# Patient Record
Sex: Male | Born: 1945 | Race: Black or African American | Hispanic: No | Marital: Single | Smoking: Former smoker
Health system: Southern US, Community
[De-identification: ages and names within clinical notes are randomized; demographics above are authoritative.]

## PROBLEM LIST (undated history)

## (undated) DIAGNOSIS — K769 Liver disease, unspecified: Secondary | ICD-10-CM

## (undated) DIAGNOSIS — I2510 Atherosclerotic heart disease of native coronary artery without angina pectoris: Secondary | ICD-10-CM

## (undated) DIAGNOSIS — I10 Essential (primary) hypertension: Secondary | ICD-10-CM

## (undated) HISTORY — PX: FRACTURE SURGERY: SHX138

---

## 2016-04-03 ENCOUNTER — Emergency Department (HOSPITAL_COMMUNITY)
Admission: EM | Admit: 2016-04-03 | Discharge: 2016-04-03 | Disposition: A | Discharge status: Home / Self Care | Payer: Commercial Managed Care - HMO | Attending: Emergency Medicine | Admitting: Emergency Medicine

## 2016-04-03 ENCOUNTER — Encounter (HOSPITAL_COMMUNITY): Payer: Self-pay | Admitting: *Deleted

## 2016-04-03 DIAGNOSIS — F1290 Cannabis use, unspecified, uncomplicated: Secondary | ICD-10-CM | POA: Insufficient documentation

## 2016-04-03 DIAGNOSIS — M62831 Muscle spasm of calf: Secondary | ICD-10-CM | POA: Diagnosis not present

## 2016-04-03 DIAGNOSIS — Z87891 Personal history of nicotine dependence: Secondary | ICD-10-CM | POA: Insufficient documentation

## 2016-04-03 DIAGNOSIS — M79605 Pain in left leg: Secondary | ICD-10-CM | POA: Diagnosis present

## 2016-04-03 DIAGNOSIS — M62838 Other muscle spasm: Secondary | ICD-10-CM

## 2016-04-03 HISTORY — DX: Essential (primary) hypertension: I10

## 2016-04-03 MED ORDER — NAPROXEN 500 MG PO TABS: 500.0000 mg | ORAL_TABLET | Freq: Two times a day (BID) | ORAL | 0 refills | Status: DC

## 2016-04-03 MED ORDER — CYCLOBENZAPRINE HCL 10 MG PO TABS: 10.0000 mg | ORAL_TABLET | Freq: Two times a day (BID) | ORAL | 0 refills | Status: DC | PRN

## 2016-04-03 NOTE — ED Triage Notes (Signed)
Pt reports L lateral thigh pain x 2 days.  Denies any injury

## 2016-04-03 NOTE — ED Notes (Signed)
Bed: WA08 Expected date:  Expected time:  Means of arrival:  Comments: 

## 2016-04-03 NOTE — Discharge Instructions (Signed)
Take your medications as prescribed as needed for pain relief. I recommend applying heat to affected area for 15 minutes 3-4 times daily. Please follow up with a primary care provider from the Resource Guide provided below in 1 week as needed. Please return to the Emergency Department if symptoms worsen or new onset of fever, back pain, chest pain, abdominal pain, numbness, saline, weakness.

## 2016-04-03 NOTE — ED Provider Notes (Signed)
[facility]-EMERGENCY DEPT Provider Note   CSN: [id number] Arrival date & time: 04/03/16  0920     History   Chief Complaint Chief Complaint  Patient presents with  . Leg Pain    HPI Jesse York is a 70 y.o. male.  Patient is a 70 year old male with history of hypertension who presents the ED with complaint of left leg pain, onset 2 days. Patient reports over the past 2 days he has had pain in his left hamstring when he wakes up in the morning. He reports having "spasms" to the back of his leg. Denies radiation. He states after he walks around for a few minutes the pain resolves. He notes he has been taking Advil and an old muscle relaxer at home without relief. Denies any recent fall, trauma or injury. Denies any new exercise or activity. Denies fever, neck or back pain, chest pain, abdominal pain, saddle anesthesia, urinary or bowel incontinence, numbness, tingling, weakness. Denies any recent surgeries/mobilization, recent long car rides/airplane travel, history of cancer, history of blood clots, leg swelling.      Past Medical History:  Diagnosis Date  . Hypertension    "Borderline"    There are no active problems to display for this patient.   Past Surgical History:  Procedure Laterality Date  . FRACTURE SURGERY Left    LLE   . FRACTURE SURGERY     Jaw       Home Medications    Prior to Admission medications   Medication Sig Start Date End Date Taking? Authorizing Provider  cyclobenzaprine (FLEXERIL) 10 MG tablet Take 1 tablet (10 mg total) by mouth 2 (two) times daily as needed for muscle spasms. 04/03/16   Barrett HenleNicole Elizabeth Lynn Recendiz, PA-C  naproxen (NAPROSYN) 500 MG tablet Take 1 tablet (500 mg total) by mouth 2 (two) times daily. 04/03/16   Barrett HenleNicole Elizabeth Nahmir Zeidman, PA-C    Family History No family history on file.  Social History Social History  Substance Use Topics  . Smoking status: Former Games developermoker  . Smokeless tobacco: Never Used  . Alcohol use No      Allergies   Review of patient's allergies indicates not on file.   Review of Systems Review of Systems  Musculoskeletal: Positive for arthralgias (left thigh pain).  All other systems reviewed and are negative.    Physical Exam Updated Vital Signs BP 143/80 (BP Location: Left Arm)   Pulse (!) 56   Temp 98.6 F (37 C) (Oral)   Resp 17   Ht 5\' 9"  (1.753 m)   Wt 104.3 kg   SpO2 95%   BMI 33.97 kg/m   Physical Exam  Constitutional: He is oriented to person, place, and time. He appears well-developed and well-nourished.  HENT:  Head: Normocephalic and atraumatic.  Mouth/Throat: Oropharynx is clear and moist. No oropharyngeal exudate.  Eyes: Conjunctivae and EOM are normal. Right eye exhibits no discharge. Left eye exhibits no discharge. No scleral icterus.  Neck: Normal range of motion. Neck supple.  Cardiovascular: Normal rate, regular rhythm, normal heart sounds and intact distal pulses.   Pulmonary/Chest: Effort normal and breath sounds normal. No respiratory distress. He has no wheezes. He has no rales. He exhibits no tenderness.  Abdominal: Soft. Bowel sounds are normal. He exhibits no distension and no mass. There is no tenderness. There is no rebound and no guarding. No hernia.  Musculoskeletal: Normal range of motion. He exhibits no edema or deformity.       Left upper leg: He exhibits  tenderness (mild). He exhibits no swelling, no edema, no deformity and no laceration.  No midline C, T, or L tenderness. Full range of motion of neck and back. Full range of motion of bilateral upper and lower extremities, with 5/5 strength. Mild TTP over left hamstring. No TTP over hips, no pelvic instability. Sensation intact. 2+ radial and PT pulses. Cap refill <2 seconds.   Neurological: He is alert and oriented to person, place, and time.  Skin: Skin is warm and dry.  Nursing note and vitals reviewed.    ED Treatments / Results  Labs (all labs ordered are listed, but only  abnormal results are displayed) Labs Reviewed - No data to display  EKG  EKG Interpretation None       Radiology No results found.  Procedures Procedures (including critical care time)  Medications Ordered in ED Medications - No data to display   Initial Impression / Assessment and Plan / ED Course  I have reviewed the triage vital signs and the nursing notes.  Pertinent labs & imaging results that were available during my care of the patient were reviewed by me and considered in my medical decision making (see chart for details).  Clinical Course    Patient presents with pain to his left hamstring which he describes as "spasms". Denies any recent fall, trauma or injury. VSS. Exam revealed mild tenderness over left hamstring. Full range of motion of bilateral lower extremities which are neurovascularly intact. No midline spinal tenderness. No abdominal tenderness. Remaining exam unremarkable. Suspect patient's symptoms are likely due to muscle spasm. Plan discharge patient home with muscle relaxant, NSAID and symptomatic treatment. Patient given information to follow up with PCP. Discussed return precautions.  Final Clinical Impressions(s) / ED Diagnoses   Final diagnoses:  Muscle spasm    New Prescriptions New Prescriptions   CYCLOBENZAPRINE (FLEXERIL) 10 MG TABLET    Take 1 tablet (10 mg total) by mouth 2 (two) times daily as needed for muscle spasms.   NAPROXEN (NAPROSYN) 500 MG TABLET    Take 1 tablet (500 mg total) by mouth 2 (two) times daily.     Satira Sark [place], [place] 04/03/16 1145    Gerhard Munch, MD 04/04/16 [phone]

## 2016-04-05 ENCOUNTER — Encounter (HOSPITAL_COMMUNITY): Payer: Self-pay | Admitting: *Deleted

## 2016-04-05 ENCOUNTER — Emergency Department (HOSPITAL_COMMUNITY)
Admission: EM | Admit: 2016-04-05 | Discharge: 2016-04-05 | Disposition: A | Discharge status: Home / Self Care | Payer: Commercial Managed Care - HMO | Attending: Emergency Medicine | Admitting: Emergency Medicine

## 2016-04-05 DIAGNOSIS — Z79899 Other long term (current) drug therapy: Secondary | ICD-10-CM | POA: Insufficient documentation

## 2016-04-05 DIAGNOSIS — R030 Elevated blood-pressure reading, without diagnosis of hypertension: Secondary | ICD-10-CM | POA: Diagnosis not present

## 2016-04-05 DIAGNOSIS — Z87891 Personal history of nicotine dependence: Secondary | ICD-10-CM | POA: Diagnosis not present

## 2016-04-05 DIAGNOSIS — F1290 Cannabis use, unspecified, uncomplicated: Secondary | ICD-10-CM | POA: Diagnosis not present

## 2016-04-05 DIAGNOSIS — R300 Dysuria: Secondary | ICD-10-CM | POA: Diagnosis present

## 2016-04-05 DIAGNOSIS — Z9114 Patient's other noncompliance with medication regimen: Secondary | ICD-10-CM | POA: Diagnosis not present

## 2016-04-05 DIAGNOSIS — N342 Other urethritis: Secondary | ICD-10-CM | POA: Insufficient documentation

## 2016-04-05 LAB — URINALYSIS, ROUTINE W REFLEX MICROSCOPIC
BILIRUBIN URINE: NEGATIVE
Glucose, UA: NEGATIVE mg/dL
Hgb urine dipstick: NEGATIVE
Ketones, ur: NEGATIVE mg/dL
Leukocytes, UA: NEGATIVE
NITRITE: NEGATIVE
PH: 5.5 (ref 5.0–8.0)
Protein, ur: NEGATIVE mg/dL
SPECIFIC GRAVITY, URINE: 1.022 (ref 1.005–1.030)

## 2016-04-05 LAB — GC/CHLAMYDIA PROBE AMP (~~LOC~~) NOT AT ARMC
Chlamydia: NEGATIVE
Neisseria Gonorrhea: NEGATIVE

## 2016-04-05 MED ORDER — LIDOCAINE HCL 1 % IJ SOLN
INTRAMUSCULAR | Status: AC
  Administered 2016-04-05: 0.9 mL
  Filled 2016-04-05: qty 20

## 2016-04-05 MED ORDER — CEFTRIAXONE SODIUM 250 MG IJ SOLR
250.0000 mg | Freq: Once | INTRAMUSCULAR | Status: AC
Start: 2016-04-05 — End: 2016-04-05
  Administered 2016-04-05: 250 mg via INTRAMUSCULAR
  Filled 2016-04-05: qty 250

## 2016-04-05 MED ORDER — AZITHROMYCIN 250 MG PO TABS
1000.0000 mg | ORAL_TABLET | Freq: Once | ORAL | Status: AC
  Administered 2016-04-05: 1000 mg via ORAL
  Filled 2016-04-05: qty 4

## 2016-04-05 NOTE — Discharge Instructions (Signed)
Use a condom each time you have sex. [facility]. A primary care physician. Your blood pressure should be rechecked in a week. Today's was elevated at 164/90. Take your blood pressure medication as prescribed and all medications as prescribed.

## 2016-04-05 NOTE — ED Triage Notes (Signed)
Pt states that he had unprotected sex and oral sex on Wed;  Pt states that since then he is having a tingling sensation to his penis a dry throat; pt denies drainage from his penis or any noticeable irritation or bumps

## 2016-04-05 NOTE — ED Provider Notes (Signed)
[facility]PT Provider Note   CSN: [id number] Arrival date & time: 04/05/16  [id number]     History   Chief Complaint Chief Complaint  Patient presents with  . SEXUALLY TRANSMITTED DISEASE    HPI Jesse York is a 70 y.o. male.Patient has burning at penis with urination since having unprotected sex. Dysuria started yesterday. He had unprotected sex 2 days ago. He also complains of a vague discomfort in his throat. He did perform oral sex. No other associated symptoms. No treatment prior to coming here burning worse with urination not improved by anything.  HPI  Past Medical History:  Diagnosis Date  . Hypertension    "Borderline"   Liver disease There are no active problems to display for this patient.   Past Surgical History:  Procedure Laterality Date  . FRACTURE SURGERY Left    LLE   . FRACTURE SURGERY     Jaw       Home Medications    Prior to Admission medications   Medication Sig Start Date End Date Taking? Authorizing Provider  cyclobenzaprine (FLEXERIL) 10 MG tablet Take 1 tablet (10 mg total) by mouth 2 (two) times daily as needed for muscle spasms. 04/03/16   Barrett HenleNicole Elizabeth Nadeau, PA-C  naproxen (NAPROSYN) 500 MG tablet Take 1 tablet (500 mg total) by mouth 2 (two) times daily. 04/03/16   Barrett HenleNicole Elizabeth Nadeau, PA-C   Unknown blood pressure medication he's been noncompliant for one week Family History No family history on file.  Social History Social History  Substance Use Topics  . Smoking status: Former Games developermoker  . Smokeless tobacco: Never Used  . Alcohol use No     Allergies   Review of patient's allergies indicates no known allergies.   Review of Systems Review of Systems  Constitutional: Negative.   HENT: Positive for sore throat.   Respiratory: Negative.   Cardiovascular: Negative.   Gastrointestinal: Negative.   Genitourinary: Positive for dysuria.  Musculoskeletal: Negative.   Skin: Negative.   Neurological: Negative.     Psychiatric/Behavioral: Negative.   All other systems reviewed and are negative.    Physical Exam Updated Vital Signs BP 164/90 (BP Location: Left Arm)   Pulse (!) 58   Temp 97.6 F (36.4 C) (Oral)   Resp 15   SpO2 100%   Physical Exam  Constitutional: He appears well-developed and well-nourished.  HENT:  Head: Normocephalic and atraumatic.  Eyes: Conjunctivae are normal. Pupils are equal, round, and reactive to light.  Neck: Neck supple. No tracheal deviation present. No thyromegaly present.  Cardiovascular: Normal rate and regular rhythm.   No murmur heard. Pulmonary/Chest: Effort normal and breath sounds normal.  Abdominal: Soft. Bowel sounds are normal. He exhibits no distension. There is no tenderness.  Genitourinary: Penis normal.  Genitourinary Comments: Circumcized, no urethral d/c  Musculoskeletal: Normal range of motion. He exhibits no edema or tenderness.  Neurological: He is alert. Coordination normal.  Skin: Skin is warm and dry. No rash noted.  Psychiatric: He has a normal mood and affect.  Nursing note and vitals reviewed.    ED Treatments / Results  Labs (all labs ordered are listed, but only abnormal results are displayed) Labs Reviewed  URINALYSIS, ROUTINE W REFLEX MICROSCOPIC (NOT AT [facility])  RPR  HIV ANTIBODY (ROUTINE TESTING)  GC/CHLAMYDIA PROBE AMP ([place]) NOT AT [facility]    EKG  EKG Interpretation None       Radiology No results found.  Procedures Procedures (including critical care time)  Medications  Ordered in ED Medications  cefTRIAXone (ROCEPHIN) injection 250 mg (not administered)  azithromycin (ZITHROMAX) tablet 1,000 mg (not administered)     Initial Impression / Assessment and Plan / ED Course  I have reviewed the triage vital signs and the nursing notes.  Pertinent labs & imaging results that were available during my care of the patient were reviewed by me and considered in my medical decision making (see chart for  details).  Clinical Course    Treat empirically for STDs. Safe sex encouraged. Blood pressure recheck one week. I stressed importance of taking blood pressure medications. Referral [place] [facility] to get PCP  Final Clinical Impressions(s) / ED Diagnoses  Diagnoses #1 urethritis #2 elevated blood pressure #3 medication noncompliance Final diagnoses:  Urethritis    New Prescriptions New Prescriptions   No medications on file     Doug Sou, MD 04/05/16 [phone]

## 2016-04-05 NOTE — ED Notes (Signed)
Bed: WA05 Expected date:  Expected time:  Means of arrival:  Comments: 

## 2016-04-06 LAB — RPR: RPR: NONREACTIVE

## 2016-04-06 LAB — HIV ANTIBODY (ROUTINE TESTING W REFLEX): HIV SCREEN 4TH GENERATION: NONREACTIVE

## 2016-08-05 ENCOUNTER — Emergency Department (HOSPITAL_COMMUNITY): Payer: Commercial Managed Care - HMO

## 2016-08-05 ENCOUNTER — Encounter (HOSPITAL_COMMUNITY): Payer: Self-pay | Admitting: Emergency Medicine

## 2016-08-05 DIAGNOSIS — R079 Chest pain, unspecified: Secondary | ICD-10-CM | POA: Insufficient documentation

## 2016-08-05 DIAGNOSIS — Z91040 Latex allergy status: Secondary | ICD-10-CM | POA: Insufficient documentation

## 2016-08-05 DIAGNOSIS — A64 Unspecified sexually transmitted disease: Secondary | ICD-10-CM | POA: Insufficient documentation

## 2016-08-05 DIAGNOSIS — Z87891 Personal history of nicotine dependence: Secondary | ICD-10-CM | POA: Diagnosis not present

## 2016-08-05 DIAGNOSIS — Z79899 Other long term (current) drug therapy: Secondary | ICD-10-CM | POA: Insufficient documentation

## 2016-08-05 DIAGNOSIS — I2510 Atherosclerotic heart disease of native coronary artery without angina pectoris: Secondary | ICD-10-CM | POA: Diagnosis not present

## 2016-08-05 LAB — BASIC METABOLIC PANEL
Anion gap: 7 (ref 5–15)
BUN: 12 mg/dL (ref 6–20)
CHLORIDE: 105 mmol/L (ref 101–111)
CO2: 26 mmol/L (ref 22–32)
CREATININE: 0.95 mg/dL (ref 0.61–1.24)
Calcium: 8.9 mg/dL (ref 8.9–10.3)
GFR calc Af Amer: >60 mL/min (ref >60)
GFR calc non Af Amer: >60 mL/min (ref >60)
GLUCOSE: 105 mg/dL — AB (ref 65–99)
Potassium: 4.1 mmol/L (ref 3.5–5.1)
Sodium: 138 mmol/L (ref 135–145)

## 2016-08-05 LAB — CBC
HCT: 41.5 % (ref 39.0–52.0)
Hemoglobin: 14.5 g/dL (ref 13.0–17.0)
MCH: 33.6 pg (ref 26.0–34.0)
MCHC: 34.9 g/dL (ref 30.0–36.0)
MCV: 96.1 fL (ref 78.0–100.0)
PLATELETS: 175 10*3/uL (ref 150–400)
RBC: 4.32 MIL/uL (ref 4.22–5.81)
RDW: 13.8 % (ref 11.5–15.5)
WBC: 4.9 10*3/uL (ref 4.0–10.5)

## 2016-08-05 LAB — I-STAT TROPONIN, ED: Troponin i, poc: 0 ng/mL (ref 0.00–0.08)

## 2016-08-05 NOTE — ED Triage Notes (Signed)
Pt states he was having sex yesterday and started having sharp chest pain on the left side  PT states he got very diaphoretic and short of breath  Pt states he had another episode of the chest pain today  Pt denies any pain at this time  Pt states he would also like to be checked for STDs while he is here

## 2016-08-06 ENCOUNTER — Emergency Department (HOSPITAL_COMMUNITY)
Admission: EM | Admit: 2016-08-06 | Discharge: 2016-08-06 | Disposition: A | Discharge status: Home / Self Care | Payer: Commercial Managed Care - HMO | Attending: Emergency Medicine | Admitting: Emergency Medicine

## 2016-08-06 DIAGNOSIS — R079 Chest pain, unspecified: Secondary | ICD-10-CM

## 2016-08-06 DIAGNOSIS — A64 Unspecified sexually transmitted disease: Secondary | ICD-10-CM

## 2016-08-06 HISTORY — DX: Atherosclerotic heart disease of native coronary artery without angina pectoris: I25.10

## 2016-08-06 HISTORY — DX: Liver disease, unspecified: K76.9

## 2016-08-06 LAB — I-STAT TROPONIN, ED: TROPONIN I, POC: 0.01 ng/mL (ref 0.00–0.08)

## 2016-08-06 MED ORDER — CEFTRIAXONE SODIUM 250 MG IJ SOLR
250.0000 mg | Freq: Once | INTRAMUSCULAR | Status: AC
  Administered 2016-08-06: 250 mg via INTRAMUSCULAR
  Filled 2016-08-06: qty 250

## 2016-08-06 MED ORDER — LIDOCAINE HCL 1 % IJ SOLN
INTRAMUSCULAR | Status: AC
  Filled 2016-08-06: qty 20

## 2016-08-06 MED ORDER — AZITHROMYCIN 1 G PO PACK
1.0000 g | PACK | Freq: Once | ORAL | Status: AC
  Administered 2016-08-06: 1 g via ORAL
  Filled 2016-08-06: qty 1

## 2016-08-06 NOTE — ED Provider Notes (Signed)
[facility]PT Provider Note   CSN: [id number] Arrival date & time: 08/05/16  1943 By signing my name below, I, [name], attest that this documentation has been prepared under the direction and in the presence of Tomasita Crumble, MD. Electronically Signed: [name], ED Scribe. 08/06/16. 2:56 AM.  History   Chief Complaint Chief Complaint  Patient presents with  . Chest Pain  . STD check    HPI The history is provided by the patient. No language interpreter was used.   HPI Comments: Cong Jesse York is a 71 y.o. male with h/o CAD and HTN, who presents to the Emergency Department complaining of intermittent, sharp chest pain onset two days ago with associated dysuria, diaphoresis, and shortness of breath. Pt states he was having sex two days ago when the chest pain initially came on. Pt denies any modifying factors. No alleviating factors noted. Pt is concerned his symptoms are consistent with an STD; he is currently requesting to be checked, noting he has a new sexual partner. Pt denies fever, chills, vomiting, penile discharge, or any other associated symptoms.   Past Medical History:  Diagnosis Date  . CAD (coronary artery disease)   . Hypertension    "Borderline"  . Liver disease     There are no active problems to display for this patient.   Past Surgical History:  Procedure Laterality Date  . FRACTURE SURGERY Left    LLE   . FRACTURE SURGERY     Jaw       Home Medications    Prior to Admission medications   Medication Sig Start Date End Date Taking? Authorizing Provider  cyclobenzaprine (FLEXERIL) 10 MG tablet Take 1 tablet (10 mg total) by mouth 2 (two) times daily as needed for muscle spasms. 04/03/16   Barrett Henle, PA-C  naproxen (NAPROSYN) 500 MG tablet Take 1 tablet (500 mg total) by mouth 2 (two) times daily. 04/03/16   Barrett Henle, PA-C    Family History Family History  Problem Relation Age of Onset  . Seizures Brother   .  Hypertension Other   . Diabetes Other     Social History Social History  Substance Use Topics  . Smoking status: Former Games developer  . Smokeless tobacco: Never Used  . Alcohol use Yes     Comment: occ     Allergies   Latex   Review of Systems Review of Systems  Constitutional: Positive for diaphoresis. Negative for chills and fever.  Respiratory: Positive for shortness of breath.   Cardiovascular: Positive for chest pain.  Gastrointestinal: Negative for vomiting.  Genitourinary: Positive for dysuria. Negative for discharge.  All other systems reviewed and are negative.    Physical Exam Updated Vital Signs BP 169/80 (BP Location: Left Arm)   Pulse 65   Temp 98.3 F (36.8 C) (Oral)   Resp 18   Ht 5\' 9"  (1.753 m)   Wt 230 lb (104.3 kg)   SpO2 97%   BMI 33.97 kg/m   Physical Exam  Constitutional: He is oriented to person, place, and time. Vital signs are normal. He appears well-developed and well-nourished.  Non-toxic appearance. He does not appear ill. No distress.  HENT:  Head: Normocephalic and atraumatic.  Nose: Nose normal.  Mouth/Throat: Oropharynx is clear and moist. No oropharyngeal exudate.  Eyes: Conjunctivae and EOM are normal. Pupils are equal, round, and reactive to light. No scleral icterus.  Neck: Normal range of motion. Neck supple. No tracheal deviation, no edema, no erythema  and normal range of motion present. No thyroid mass and no thyromegaly present.  Cardiovascular: Normal rate, regular rhythm, S1 normal, S2 normal, normal heart sounds, intact distal pulses and normal pulses.  Exam reveals no gallop and no friction rub.   No murmur heard. Pulmonary/Chest: Effort normal and breath sounds normal. No respiratory distress. He has no wheezes. He has no rhonchi. He has no rales.  Abdominal: Soft. Normal appearance and bowel sounds are normal. He exhibits no distension, no ascites and no mass. There is no hepatosplenomegaly. There is no tenderness. There is  no rebound, no guarding and no CVA tenderness.  Genitourinary: Testes normal and penis normal. Right testis shows no swelling and no tenderness. Left testis shows no swelling and no tenderness. No penile tenderness. No discharge found.  Musculoskeletal: Normal range of motion. He exhibits no edema or tenderness.  Lymphadenopathy:    He has no cervical adenopathy.  Neurological: He is alert and oriented to person, place, and time. He has normal strength. No cranial nerve deficit or sensory deficit.  Skin: Skin is warm, dry and intact. No petechiae and no rash noted. He is not diaphoretic. No erythema. No pallor.  Nursing note and vitals reviewed.    ED Treatments / Results  DIAGNOSTIC STUDIES: Oxygen Saturation is 97% on RA, adequate by my interpretation.    COORDINATION OF CARE: 2:56 AM Discussed treatment plan with pt at bedside which includes STD check and pt agreed to plan.  Labs (all labs ordered are listed, but only abnormal results are displayed) Labs Reviewed  BASIC METABOLIC PANEL - Abnormal; Notable for the following:       Result Value   Glucose, Bld 105 (*)    All other components within normal limits  CBC  URINALYSIS, ROUTINE W REFLEX MICROSCOPIC  I-STAT TROPOININ, ED  I-STAT TROPOININ, ED  GC/CHLAMYDIA PROBE AMP ([place]) NOT AT [facility]    EKG  EKG Interpretation  Date/Time:  Monday August 05 2016 19:57:18 EST Ventricular Rate:  62 PR Interval:    QRS Duration: 101 QT Interval:  438 QTC Calculation: 445 R Axis:   91 Text Interpretation:  Sinus rhythm Ventricular premature complex Probable left atrial enlargement Right axis deviation No old tracing to compare Confirmed by Erroll Luna [phone]) on 08/06/2016 2:34:22 AM       Radiology Dg Chest 2 View  Result Date: 08/05/2016 CLINICAL DATA:  Acute onset of generalized chest pain. Initial encounter. EXAM: CHEST  2 VIEW COMPARISON:  None. FINDINGS: The lungs are well-aerated. Mild vascular congestion  is noted. There is no evidence of focal opacification, pleural effusion or pneumothorax. The heart is borderline normal in size. No acute osseous abnormalities are seen. IMPRESSION: Mild vascular congestion noted.  Lungs remain grossly clear. Electronically Signed   By: Roanna Raider M.D.   On: 08/05/2016 21:04    Procedures Procedures (including critical care time)  Medications Ordered in ED Medications  cefTRIAXone (ROCEPHIN) injection 250 mg (not administered)  azithromycin (ZITHROMAX) powder 1 g (not administered)     Initial Impression / Assessment and Plan / ED Course  I have reviewed the triage vital signs and the nursing notes.  Pertinent labs & imaging results that were available during my care of the patient were reviewed by me and considered in my medical decision making (see chart for details).     Patient presents to the ED for intermittent sharp CP with some diaphoresis.  There is no exertional component or radiation.  History  is low rik for ACS.  HEART score is 3 for age and risk factors.  Repeat tropoin and EKG are pending.  Will treat STD symptoms with ceftriaxone and azithromycin.  Repeat troponin and EKG are negative. HEART score remains 3.  He appears well and in NAD.  VS remain within his normal limits and he is safe for DC.   Final Clinical Impressions(s) / ED Diagnoses   Final diagnoses:  None    New Prescriptions New Prescriptions   No medications on file   I personally performed the services described in this documentation, which was scribed in my presence. The recorded information has been reviewed and is accurate.      Tomasita CrumbleAdeleke Aryon Nham, MD 08/06/16 [id number]

## 2017-09-16 ENCOUNTER — Emergency Department (HOSPITAL_COMMUNITY)
Admission: EM | Admit: 2017-09-16 | Discharge: 2017-09-16 | Disposition: A | Discharge status: Home / Self Care | Payer: Medicare HMO | Attending: Emergency Medicine | Admitting: Emergency Medicine

## 2017-09-16 ENCOUNTER — Encounter (HOSPITAL_COMMUNITY): Payer: Self-pay | Admitting: Emergency Medicine

## 2017-09-16 DIAGNOSIS — I2510 Atherosclerotic heart disease of native coronary artery without angina pectoris: Secondary | ICD-10-CM | POA: Insufficient documentation

## 2017-09-16 DIAGNOSIS — Z79899 Other long term (current) drug therapy: Secondary | ICD-10-CM | POA: Diagnosis not present

## 2017-09-16 DIAGNOSIS — Z202 Contact with and (suspected) exposure to infections with a predominantly sexual mode of transmission: Secondary | ICD-10-CM | POA: Insufficient documentation

## 2017-09-16 DIAGNOSIS — I10 Essential (primary) hypertension: Secondary | ICD-10-CM | POA: Diagnosis not present

## 2017-09-16 DIAGNOSIS — R300 Dysuria: Secondary | ICD-10-CM | POA: Diagnosis present

## 2017-09-16 DIAGNOSIS — Z87891 Personal history of nicotine dependence: Secondary | ICD-10-CM | POA: Diagnosis not present

## 2017-09-16 LAB — URINALYSIS, ROUTINE W REFLEX MICROSCOPIC
BILIRUBIN URINE: NEGATIVE
Glucose, UA: NEGATIVE mg/dL
HGB URINE DIPSTICK: NEGATIVE
KETONES UR: NEGATIVE mg/dL
Leukocytes, UA: NEGATIVE
Nitrite: NEGATIVE
PROTEIN: NEGATIVE mg/dL
Specific Gravity, Urine: 1.019 (ref 1.005–1.030)
pH: 6 (ref 5.0–8.0)

## 2017-09-16 LAB — RAPID HIV SCREEN (HIV 1/2 AB+AG)
HIV 1/2 Antibodies: NONREACTIVE
HIV-1 P24 Antigen - HIV24: NONREACTIVE

## 2017-09-16 MED ORDER — METRONIDAZOLE 500 MG PO TABS
2000.0000 mg | ORAL_TABLET | Freq: Once | ORAL | Status: AC
  Administered 2017-09-16: 2000 mg via ORAL
  Filled 2017-09-16: qty 4

## 2017-09-16 MED ORDER — STERILE WATER FOR INJECTION IJ SOLN
INTRAMUSCULAR | Status: AC
  Administered 2017-09-16: 10 mL
  Filled 2017-09-16: qty 10

## 2017-09-16 MED ORDER — CEFTRIAXONE SODIUM 250 MG IJ SOLR
250.0000 mg | Freq: Once | INTRAMUSCULAR | Status: AC
  Administered 2017-09-16: 250 mg via INTRAMUSCULAR
  Filled 2017-09-16: qty 250

## 2017-09-16 MED ORDER — AZITHROMYCIN 250 MG PO TABS
1000.0000 mg | ORAL_TABLET | Freq: Once | ORAL | Status: AC
  Administered 2017-09-16: 1000 mg via ORAL
  Filled 2017-09-16: qty 4

## 2017-09-16 NOTE — ED Triage Notes (Signed)
Pt c/o burning and pain with urination, states recently had std exposure.

## 2017-09-16 NOTE — ED Provider Notes (Signed)
[facility]-EMERGENCY DEPT Provider Note   CSN: [id number] Arrival date & time: 09/16/17  1144     History   Chief Complaint Chief Complaint  Patient presents with  . Dysuria  . STD exposure    HPI Jesse York is a 72 y.o. male with a hx of HTN and CAD who presents to the ED with concern for STD due to sxs of dysuria and urinary frequency x 3 days. States sxs have persisted, no necessarily worsening. Has not tried any interventions, no alleviating/aggravating. Patient states recent sexual partner diagnosed with trichomonas. Denies fever, chills, nausea, vomiting, penile discharge, testicular pain/swelling, abdominal pain, or pain with bowel movements.  HPI  Past Medical History:  Diagnosis Date  . CAD (coronary artery disease)   . Hypertension    "Borderline"  . Liver disease     There are no active problems to display for this patient.   Past Surgical History:  Procedure Laterality Date  . FRACTURE SURGERY Left    LLE   . FRACTURE SURGERY     Jaw       Home Medications    Prior to Admission medications   Medication Sig Start Date End Date Taking? Authorizing Provider  doxazosin (CARDURA) 2 MG tablet Take 2 mg by mouth daily.   Yes [provider]  HARVONI 90-400 MG TABS Take 1 tablet by mouth daily. 08/27/17  Yes [provider]    Family History Family History  Problem Relation Age of Onset  . Seizures Brother   . Hypertension Other   . Diabetes Other     Social History Social History   Tobacco Use  . Smoking status: Former Games developer  . Smokeless tobacco: Never Used  Substance Use Topics  . Alcohol use: Yes    Comment: occ  . Drug use: Yes    Types: Marijuana     Allergies   Latex   Review of Systems Review of Systems  Constitutional: Negative for chills and fever.  Respiratory: Negative for shortness of breath.   Cardiovascular: Negative for chest pain.  Gastrointestinal: Negative for abdominal  pain, blood in stool, constipation, diarrhea, nausea and vomiting.  Genitourinary: Positive for dysuria and frequency. Negative for discharge, hematuria, scrotal swelling and testicular pain.  Neurological: Negative for dizziness, weakness, numbness and headaches.   Physical Exam Updated Vital Signs BP (!) 191/93 (BP Location: Left Arm)   Pulse (!) 50   Temp 98.2 F (36.8 C) (Oral)   Resp 16   SpO2 100%   Physical Exam  Constitutional: He appears well-developed and well-nourished. No distress.  HENT:  Head: Normocephalic and atraumatic.  Eyes: Conjunctivae are normal. Right eye exhibits no discharge. Left eye exhibits no discharge.  Cardiovascular: Normal rate and regular rhythm.  Pulmonary/Chest: Effort normal and breath sounds normal.  Abdominal: Soft. He exhibits no distension. There is no tenderness.  Genitourinary: Testes normal. Right testis shows no mass, no swelling and no tenderness. Left testis shows no mass, no swelling and no tenderness. Circumcised.  Genitourinary Comments: EDT present as chaperone throughout exam. No discharge or lesions noted.   Lymphadenopathy: No inguinal adenopathy noted on the right or left side.  Neurological: He is alert.  Clear speech.   Psychiatric: He has a normal mood and affect. His behavior is normal. Thought content normal.  Nursing note and vitals reviewed.   ED Treatments / Results  Labs Results for orders placed or performed during the hospital encounter of 09/16/17  Urinalysis, Routine  w reflex microscopic  Result Value Ref Range   Color, Urine YELLOW YELLOW   APPearance CLEAR CLEAR   Specific Gravity, Urine 1.019 1.005 - 1.030   pH 6.0 5.0 - 8.0   Glucose, UA NEGATIVE NEGATIVE mg/dL   Hgb urine dipstick NEGATIVE NEGATIVE   Bilirubin Urine NEGATIVE NEGATIVE   Ketones, ur NEGATIVE NEGATIVE mg/dL   Protein, ur NEGATIVE NEGATIVE mg/dL   Nitrite NEGATIVE NEGATIVE   Leukocytes, UA NEGATIVE NEGATIVE   No results found. EKG   EKG Interpretation None       Radiology No results found.  Procedures Procedures (including critical care time)  Medications Ordered in ED Medications  azithromycin (ZITHROMAX) tablet 1,000 mg (not administered)  cefTRIAXone (ROCEPHIN) injection 250 mg (not administered)  metroNIDAZOLE (FLAGYL) tablet 2,000 mg (not administered)     Initial Impression / Assessment and Plan / ED Course  I have reviewed the triage vital signs and the nursing notes.  Pertinent labs & imaging results that were available during my care of the patient were reviewed by me and considered in my medical decision making (see chart for details).    Patient presents with dysuria/frequency and concern for STD. Patient is nontoxic appearing, in no apparent distress, noted to be hypertensive- no indication of HTN emergency, patient aware of need for recheck.  No indication of epididymitis, orchitis, or prostatitis on H&P. UA grossly unremarkable.  STD cultures obtained including HIV, syphilis, gonorrhea and chlamydia. Treated prophylactically with Azithromycin, Metronidazole, and Ceftriaxone. Patient aware cultures pending and need to tell partners if positive, discussed protection with intercourse. I discussed results, treatment plan, need for PCP  follow-up, and return precautions with the patient. Provided opportunity for questions, patient confirmed understanding and is in agreement with plan.    Final Clinical Impressions(s) / ED Diagnoses   Final diagnoses:  Exposure to sexually transmitted disease (STD)    ED Discharge Orders    None       Cherly Andersonetrucelli, Bowen Goyal R, PA-C 09/16/17 1614    Nira Connardama, Pedro Eduardo, MD 09/16/17 1935

## 2017-09-16 NOTE — Discharge Instructions (Signed)
You were seen in the emergency department today due to concern for an STD.  Your urine did not show signs of infection.  He was treated prophylactically gonorrhea, chlamydia, and Trichomonas.  Your test results are pending, we will call you with the results of your gonorrhea, chlamydia, HIV, and syphilis test.  If any of these results turn positive you will need to inform all sexual partners.  Be sure to follow-up with the health department for reevaluation of your symptoms.  Return to the emergency department with any new or worsening symptoms.   Additionally your blood pressure was elevated in the emergency department today.  You will need to have this rechecked by her primary care provider in 1 week. Vitals:   09/16/17 1149  BP: (!) 191/93  Pulse: (!) 50  Resp: 16  Temp: 98.2 F (36.8 C)  SpO2: 100%

## 2017-09-17 LAB — RPR: RPR: NONREACTIVE

## 2017-09-17 LAB — HIV ANTIBODY (ROUTINE TESTING W REFLEX): HIV SCREEN 4TH GENERATION: NONREACTIVE

## 2017-09-18 LAB — GC/CHLAMYDIA PROBE AMP (~~LOC~~) NOT AT ARMC
Chlamydia: NEGATIVE
NEISSERIA GONORRHEA: NEGATIVE

## 2017-09-18 IMAGING — CR DG CHEST 2V
2 series · 2 of 2 positions shown · non-contrast
Comparison: None.

CLINICAL DATA: Acute onset of generalized chest pain. Initial
encounter.

EXAM:
CHEST  2 VIEW

[w chest pa]
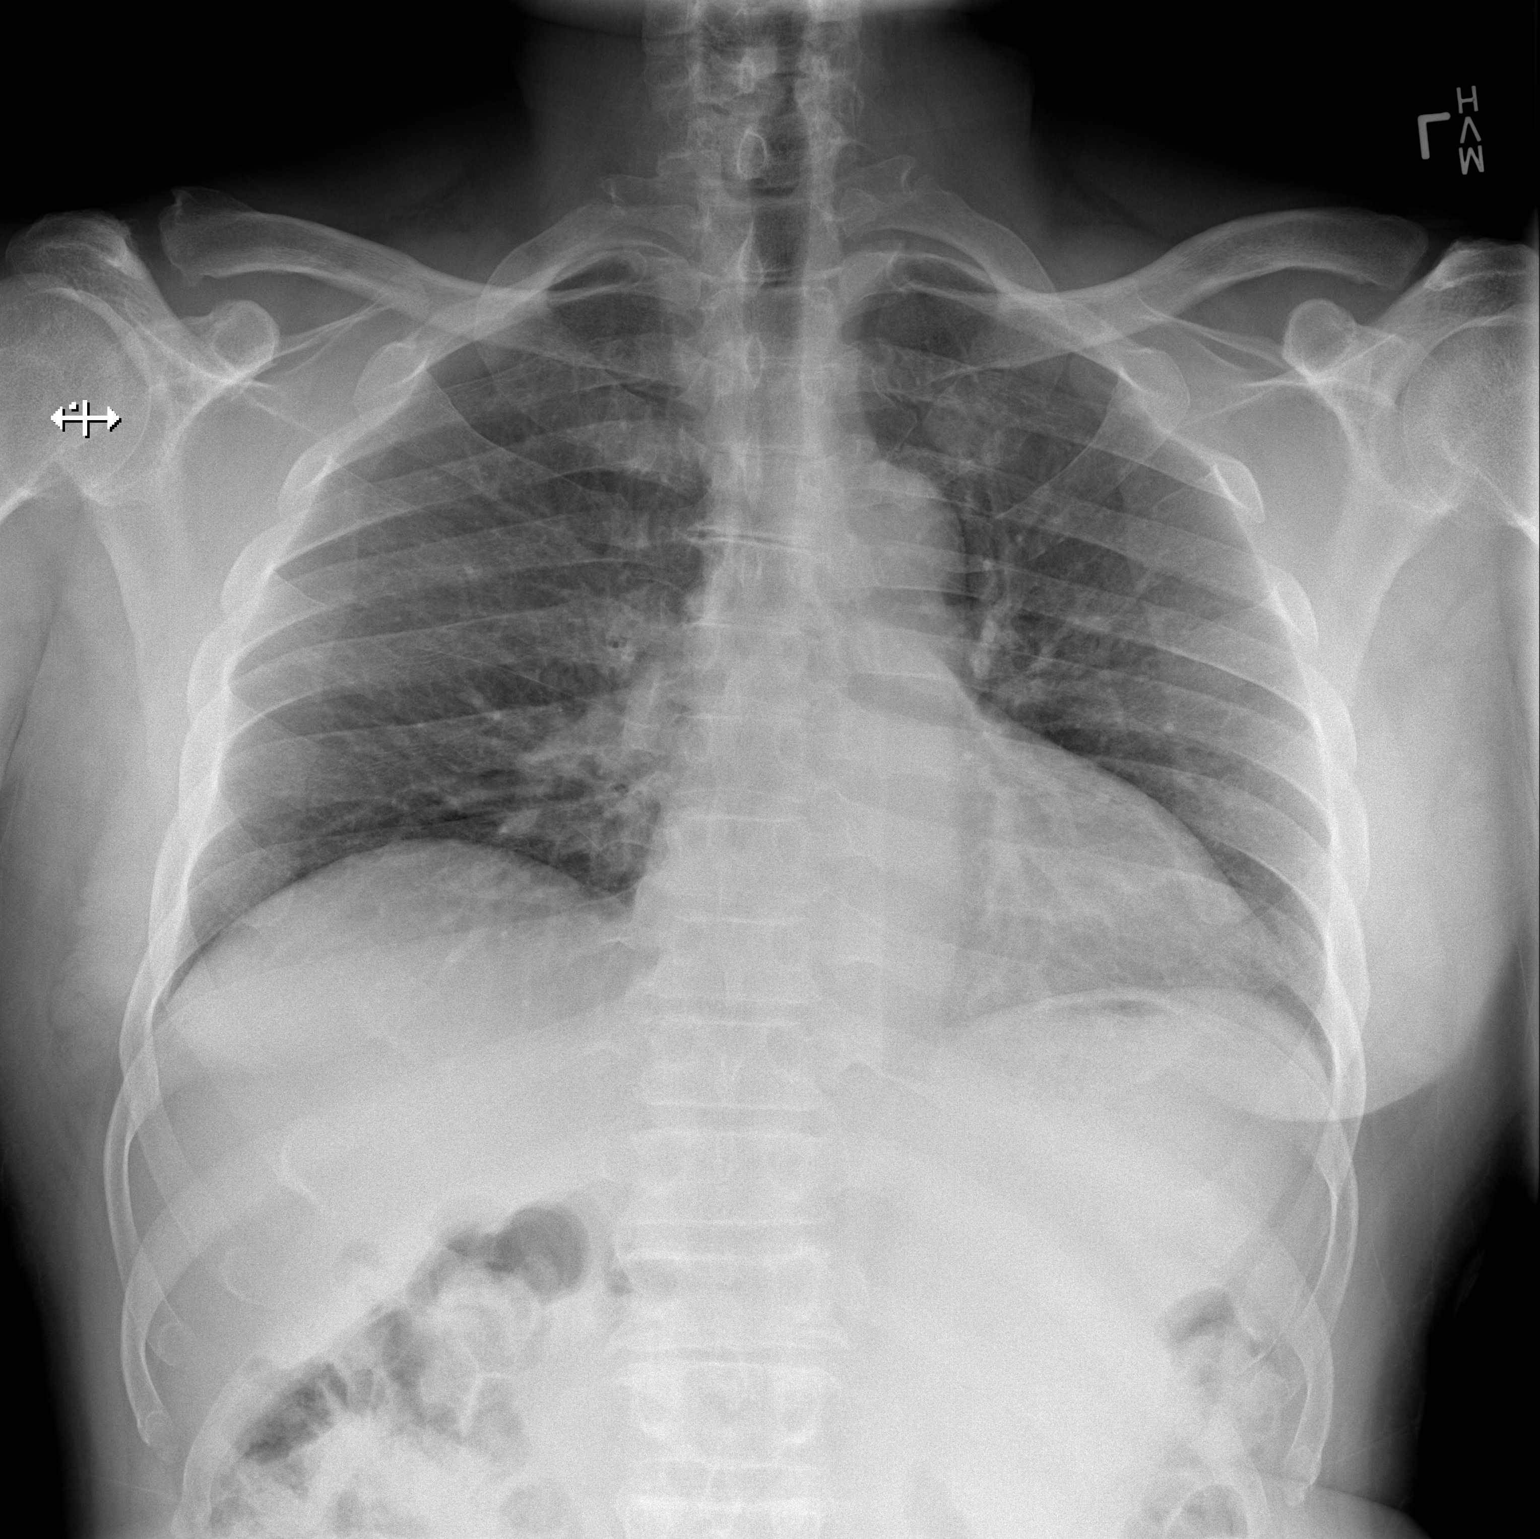

[w chest lat]
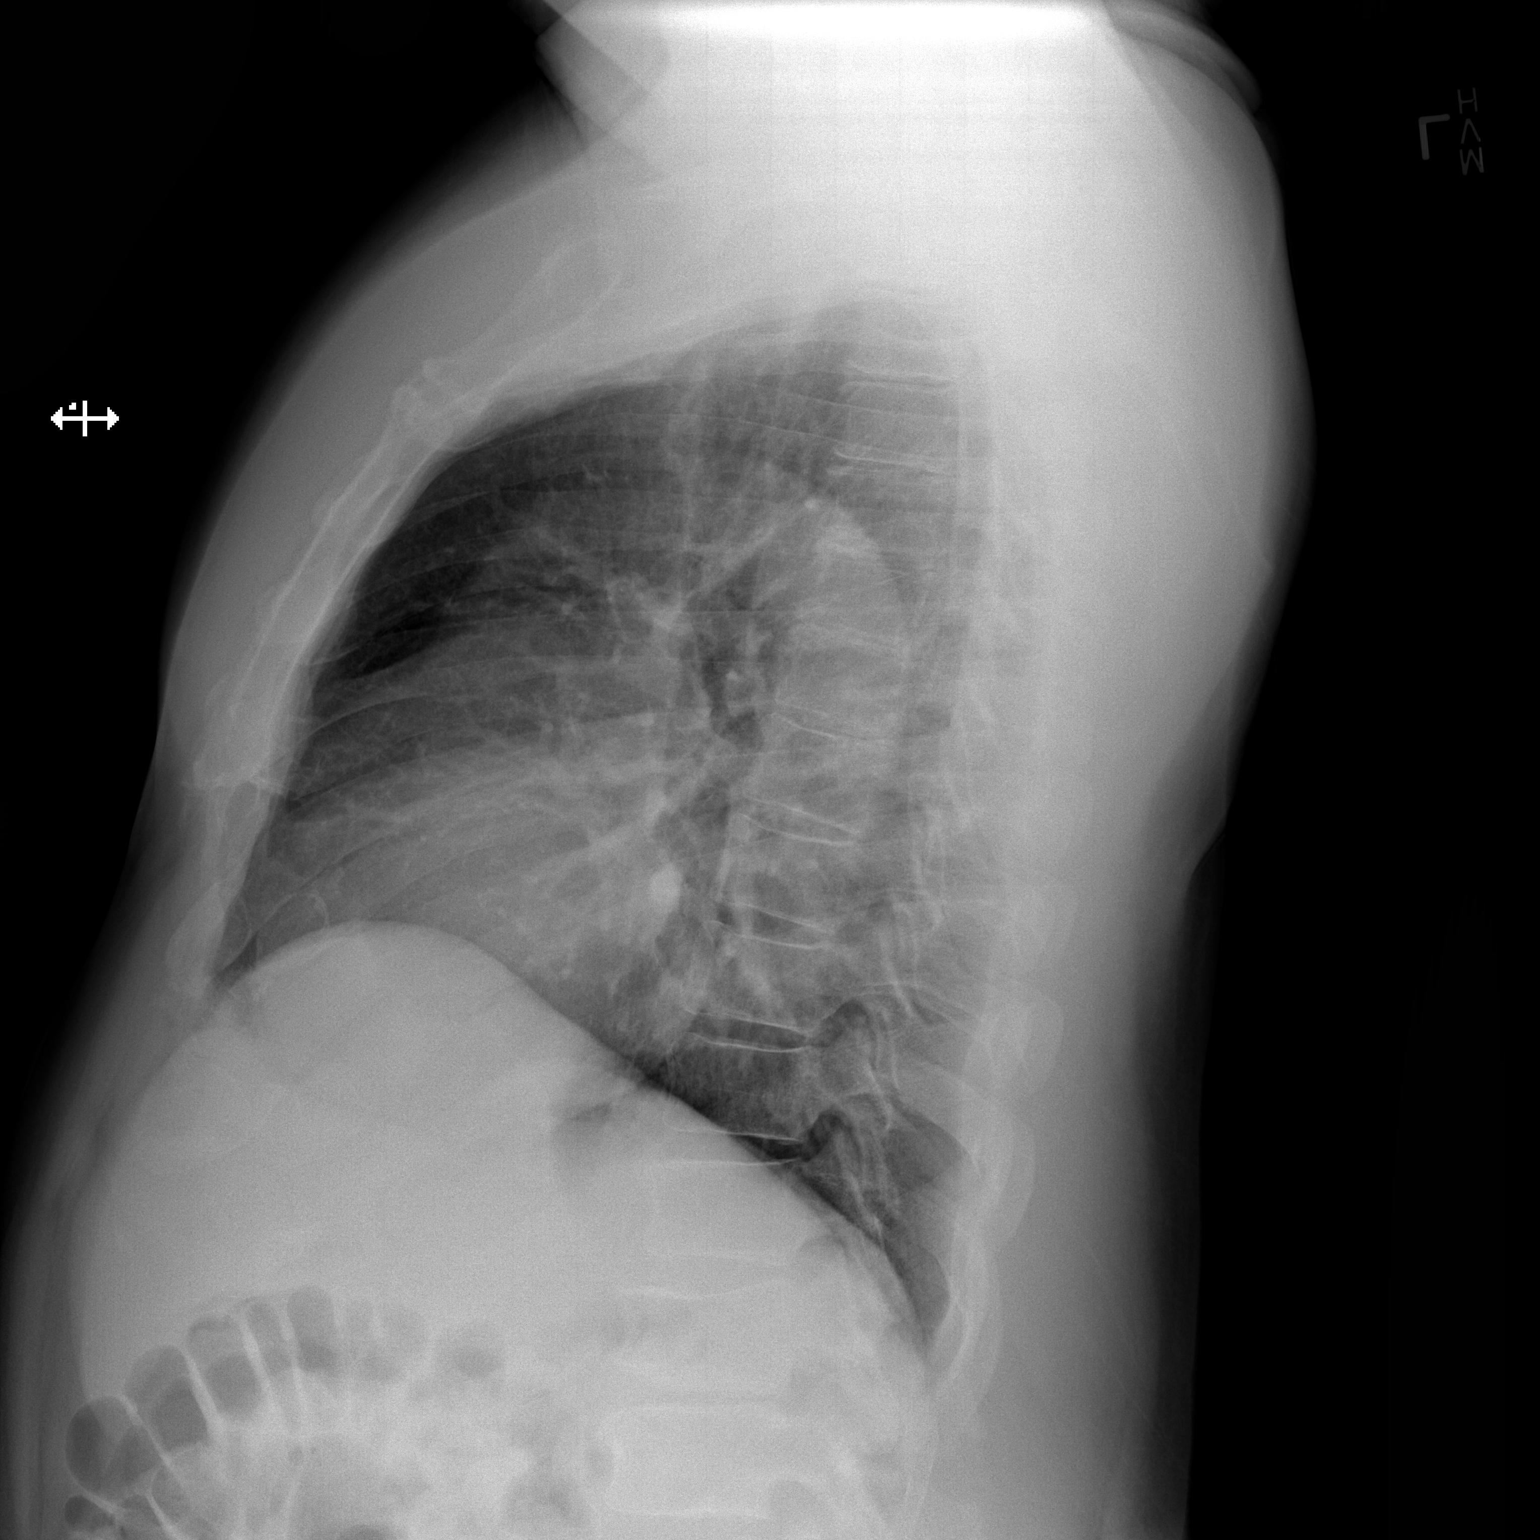

[2 of 2 positions shown; findings below may reference images not displayed]

FINDINGS: The lungs are well-aerated. Mild vascular congestion is noted. There
is no evidence of focal opacification, pleural effusion or
pneumothorax.

The heart is borderline normal in size. No acute osseous
abnormalities are seen.
IMPRESSION: Mild vascular congestion noted.  Lungs remain grossly clear.

## 2017-11-15 ENCOUNTER — Emergency Department (HOSPITAL_COMMUNITY)
Admission: EM | Admit: 2017-11-15 | Discharge: 2017-11-15 | Disposition: A | Discharge status: Home / Self Care | Payer: Medicare HMO | Attending: Emergency Medicine | Admitting: Emergency Medicine

## 2017-11-15 ENCOUNTER — Encounter (HOSPITAL_COMMUNITY): Payer: Self-pay | Admitting: Emergency Medicine

## 2017-11-15 DIAGNOSIS — A64 Unspecified sexually transmitted disease: Secondary | ICD-10-CM | POA: Diagnosis not present

## 2017-11-15 DIAGNOSIS — R300 Dysuria: Secondary | ICD-10-CM | POA: Insufficient documentation

## 2017-11-15 DIAGNOSIS — I2510 Atherosclerotic heart disease of native coronary artery without angina pectoris: Secondary | ICD-10-CM | POA: Diagnosis not present

## 2017-11-15 DIAGNOSIS — Z87891 Personal history of nicotine dependence: Secondary | ICD-10-CM | POA: Insufficient documentation

## 2017-11-15 DIAGNOSIS — I10 Essential (primary) hypertension: Secondary | ICD-10-CM | POA: Diagnosis not present

## 2017-11-15 DIAGNOSIS — N489 Disorder of penis, unspecified: Secondary | ICD-10-CM | POA: Diagnosis present

## 2017-11-15 LAB — URINALYSIS, ROUTINE W REFLEX MICROSCOPIC
Bilirubin Urine: NEGATIVE
Glucose, UA: NEGATIVE mg/dL
HGB URINE DIPSTICK: NEGATIVE
KETONES UR: NEGATIVE mg/dL
LEUKOCYTES UA: NEGATIVE
Nitrite: NEGATIVE
PH: 5 (ref 5.0–8.0)
Protein, ur: NEGATIVE mg/dL
Specific Gravity, Urine: 1.012 (ref 1.005–1.030)

## 2017-11-15 MED ORDER — AZITHROMYCIN 250 MG PO TABS
1000.0000 mg | ORAL_TABLET | Freq: Once | ORAL | Status: AC
  Administered 2017-11-15: 1000 mg via ORAL
  Filled 2017-11-15: qty 4

## 2017-11-15 MED ORDER — METRONIDAZOLE 500 MG PO TABS
2000.0000 mg | ORAL_TABLET | Freq: Once | ORAL | Status: AC
  Administered 2017-11-15: 2000 mg via ORAL
  Filled 2017-11-15: qty 4

## 2017-11-15 MED ORDER — LIDOCAINE HCL 1 % IJ SOLN
INTRAMUSCULAR | Status: AC
  Administered 2017-11-15: 2.1 mL
  Filled 2017-11-15: qty 20

## 2017-11-15 MED ORDER — CEFTRIAXONE SODIUM 250 MG IJ SOLR
250.0000 mg | Freq: Once | INTRAMUSCULAR | Status: AC
  Administered 2017-11-15: 250 mg via INTRAMUSCULAR
  Filled 2017-11-15: qty 250

## 2017-11-15 NOTE — ED Provider Notes (Signed)
[facility]-EMERGENCY DEPT Provider Note   CSN: [id number] Arrival date & time: 11/15/17  4098     History   Chief Complaint Chief Complaint  Patient presents with  . Penile Discharge    HPI Jesse York is a 72 y.o. male.  HPI   72 yo M with PMHx HTN, CAD, previous STD here with urethral pain/discharge. Pt states he had unprotected sex with male on Sunday. On Monday he began to develop burning, stinging pain with urination along with white urethral discharge. He has a h/o trichomonas with similar sx. He's had some chills but no fevers. No weight loss. He believes the male may have had trich. Pain worse with urination. No alleviating factors. No testicular pain. No n/v/d.  Past Medical History:  Diagnosis Date  . CAD (coronary artery disease)   . Hypertension    "Borderline"  . Liver disease     There are no active problems to display for this patient.   Past Surgical History:  Procedure Laterality Date  . FRACTURE SURGERY Left    LLE   . FRACTURE SURGERY     Jaw        Home Medications    Prior to Admission medications   Medication Sig Start Date End Date Taking? Authorizing Provider  doxazosin (CARDURA) 2 MG tablet Take 2 mg by mouth daily.    [provider]  HARVONI 90-400 MG TABS Take 1 tablet by mouth daily. 08/27/17   [provider]    Family History Family History  Problem Relation Age of Onset  . Seizures Brother   . Hypertension Other   . Diabetes Other     Social History Social History   Tobacco Use  . Smoking status: Former Games developer  . Smokeless tobacco: Never Used  Substance Use Topics  . Alcohol use: Yes    Comment: occ  . Drug use: Yes    Types: Marijuana     Allergies   Latex   Review of Systems Review of Systems  Constitutional: Negative for chills, fatigue and fever.  HENT: Negative for congestion and rhinorrhea.   Eyes: Negative for visual disturbance.  Respiratory: Negative  for cough, shortness of breath and wheezing.   Cardiovascular: Negative for chest pain and leg swelling.  Gastrointestinal: Negative for abdominal pain, diarrhea, nausea and vomiting.  Genitourinary: Positive for dysuria, frequency and urgency. Negative for flank pain.  Musculoskeletal: Negative for neck pain and neck stiffness.  Skin: Negative for rash and wound.  Allergic/Immunologic: Negative for immunocompromised state.  Neurological: Negative for syncope, weakness and headaches.  All other systems reviewed and are negative.    Physical Exam Updated Vital Signs BP (!) 169/91 (BP Location: Left Arm)   Pulse (!) 45 Comment: claims he usually has low heart rate  Temp 97.8 F (36.6 C) (Oral)   Resp 18   SpO2 100%   Physical Exam  Constitutional: He is oriented to person, place, and time. He appears well-developed and well-nourished. No distress.  HENT:  Head: Normocephalic and atraumatic.  Eyes: Conjunctivae are normal.  Neck: Neck supple.  Cardiovascular: Normal rate, regular rhythm and normal heart sounds.  Pulmonary/Chest: Effort normal. No respiratory distress. He has no wheezes.  Abdominal: He exhibits no distension.  Genitourinary:  Genitourinary Comments: Moderate urethral white discharge, no testicular TTP, testes descended b/l. No skin lesions.  Musculoskeletal: He exhibits no edema.  Neurological: He is alert and oriented to person, place, and time. He exhibits normal muscle tone.  Skin: Skin is warm. Capillary refill takes less than 2 seconds. No rash noted.  Nursing note and vitals reviewed.    ED Treatments / Results  Labs (all labs ordered are listed, but only abnormal results are displayed) Labs Reviewed  URINALYSIS, ROUTINE W REFLEX MICROSCOPIC  HIV ANTIBODY (ROUTINE TESTING)  RPR  GC/CHLAMYDIA PROBE AMP ([place]) NOT AT [facility]    EKG None  Radiology No results found.  Procedures Procedures (including critical care time)  Medications Ordered  in ED Medications  cefTRIAXone (ROCEPHIN) injection 250 mg (has no administration in time range)  metroNIDAZOLE (FLAGYL) tablet 2,000 mg (has no administration in time range)  azithromycin (ZITHROMAX) tablet 1,000 mg (has no administration in time range)     Initial Impression / Assessment and Plan / ED Course  I have reviewed the triage vital signs and the nursing notes.  Pertinent labs & imaging results that were available during my care of the patient were reviewed by me and considered in my medical decision making (see chart for details).     72 yo M here with urethral discharge after high risk sexual activity. GC/C swab sent, HIV, RPR. Pt treated empirically. No skin lesions, no signs of systemic illness. D/c with safe sex precautions.  Final Clinical Impressions(s) / ED Diagnoses   Final diagnoses:  STD (male)    ED Discharge Orders    None       Shaune Pollack, MD 11/15/17 1030

## 2017-11-15 NOTE — Discharge Instructions (Addendum)

## 2017-11-15 NOTE — ED Triage Notes (Signed)
Patient here from home with complaints of white penile discharge. States that he possibly was exposed to STD.

## 2017-11-16 LAB — HIV ANTIBODY (ROUTINE TESTING W REFLEX): HIV SCREEN 4TH GENERATION: NONREACTIVE

## 2017-11-16 LAB — RPR: RPR Ser Ql: NONREACTIVE

## 2017-11-17 LAB — GC/CHLAMYDIA PROBE AMP (~~LOC~~) NOT AT ARMC
CHLAMYDIA, DNA PROBE: NEGATIVE
NEISSERIA GONORRHEA: NEGATIVE
